# Patient Record
Sex: Male | Born: 1965 | Race: White | Hispanic: No | Marital: Married | Smoking: Never smoker
Health system: Southern US, Community
[De-identification: ages and names within clinical notes are randomized; demographics above are authoritative.]

## PROBLEM LIST (undated history)

## (undated) DIAGNOSIS — N200 Calculus of kidney: Secondary | ICD-10-CM

## (undated) DIAGNOSIS — J302 Other seasonal allergic rhinitis: Secondary | ICD-10-CM

## (undated) DIAGNOSIS — E7800 Pure hypercholesterolemia, unspecified: Secondary | ICD-10-CM

## (undated) DIAGNOSIS — J45909 Unspecified asthma, uncomplicated: Secondary | ICD-10-CM

## (undated) DIAGNOSIS — S5290XA Unspecified fracture of unspecified forearm, initial encounter for closed fracture: Secondary | ICD-10-CM

## (undated) DIAGNOSIS — R002 Palpitations: Secondary | ICD-10-CM

## (undated) DIAGNOSIS — E785 Hyperlipidemia, unspecified: Secondary | ICD-10-CM

## (undated) DIAGNOSIS — S42309A Unspecified fracture of shaft of humerus, unspecified arm, initial encounter for closed fracture: Secondary | ICD-10-CM

## (undated) HISTORY — DX: Unspecified fracture of unspecified forearm, initial encounter for closed fracture: S52.90XA

## (undated) HISTORY — DX: Unspecified asthma, uncomplicated: J45.909

## (undated) HISTORY — DX: Other seasonal allergic rhinitis: J30.2

## (undated) HISTORY — DX: Pure hypercholesterolemia, unspecified: E78.00

## (undated) HISTORY — DX: Palpitations: R00.2

## (undated) HISTORY — DX: Calculus of kidney: N20.0

## (undated) HISTORY — DX: Unspecified fracture of shaft of humerus, unspecified arm, initial encounter for closed fracture: S42.309A

---

## 2000-08-05 DIAGNOSIS — N200 Calculus of kidney: Secondary | ICD-10-CM

## 2000-08-05 HISTORY — DX: Calculus of kidney: N20.0

## 2000-09-13 ENCOUNTER — Encounter: Payer: Self-pay | Admitting: Emergency Medicine

## 2000-09-13 ENCOUNTER — Emergency Department (HOSPITAL_COMMUNITY): Admission: EM | Admit: 2000-09-13 | Discharge: 2000-09-13 | Payer: Self-pay | Admitting: Emergency Medicine

## 2012-08-07 ENCOUNTER — Emergency Department (HOSPITAL_BASED_OUTPATIENT_CLINIC_OR_DEPARTMENT_OTHER)
Admission: EM | Admit: 2012-08-07 | Discharge: 2012-08-07 | Disposition: A | Discharge status: Home / Self Care | Payer: BC Managed Care – PPO | Attending: Emergency Medicine | Admitting: Emergency Medicine

## 2012-08-07 ENCOUNTER — Encounter (HOSPITAL_BASED_OUTPATIENT_CLINIC_OR_DEPARTMENT_OTHER): Payer: Self-pay | Admitting: *Deleted

## 2012-08-07 ENCOUNTER — Emergency Department (HOSPITAL_BASED_OUTPATIENT_CLINIC_OR_DEPARTMENT_OTHER): Payer: BC Managed Care – PPO

## 2012-08-07 DIAGNOSIS — R110 Nausea: Secondary | ICD-10-CM | POA: Insufficient documentation

## 2012-08-07 DIAGNOSIS — Z79899 Other long term (current) drug therapy: Secondary | ICD-10-CM | POA: Insufficient documentation

## 2012-08-07 DIAGNOSIS — R002 Palpitations: Secondary | ICD-10-CM | POA: Insufficient documentation

## 2012-08-07 DIAGNOSIS — E785 Hyperlipidemia, unspecified: Secondary | ICD-10-CM | POA: Insufficient documentation

## 2012-08-07 HISTORY — DX: Hyperlipidemia, unspecified: E78.5

## 2012-08-07 LAB — COMPREHENSIVE METABOLIC PANEL
ALT: 51 U/L (ref 0–53)
AST: 30 U/L (ref 0–37)
Albumin: 4.3 g/dL (ref 3.5–5.2)
CO2: 25 mEq/L (ref 19–32)
Chloride: 102 mEq/L (ref 96–112)
Creatinine, Ser: 0.8 mg/dL (ref 0.50–1.35)
Sodium: 138 mEq/L (ref 135–145)
Total Bilirubin: 0.7 mg/dL (ref 0.3–1.2)

## 2012-08-07 LAB — CBC WITH DIFFERENTIAL/PLATELET
Basophils Absolute: 0 10*3/uL (ref 0.0–0.1)
Basophils Relative: 0 % (ref 0–1)
HCT: 45.8 % (ref 39.0–52.0)
Lymphocytes Relative: 26 % (ref 12–46)
MCHC: 35.8 g/dL (ref 30.0–36.0)
Monocytes Absolute: 0.5 10*3/uL (ref 0.1–1.0)
Neutro Abs: 3.2 10*3/uL (ref 1.7–7.7)
Neutrophils Relative %: 64 % (ref 43–77)
Platelets: 207 10*3/uL (ref 150–400)
RDW: 12.6 % (ref 11.5–15.5)
WBC: 5.1 10*3/uL (ref 4.0–10.5)

## 2012-08-07 LAB — D-DIMER, QUANTITATIVE: D-Dimer, Quant: <0.27 ug/mL-FEU (ref 0.00–0.48)

## 2012-08-07 LAB — TROPONIN I: Troponin I: <0.3 ng/mL (ref <0.30)

## 2012-08-07 MED ORDER — ASPIRIN 81 MG PO CHEW
324.0000 mg | CHEWABLE_TABLET | Freq: Once | ORAL | Status: AC
  Administered 2012-08-07: 324 mg via ORAL

## 2012-08-07 MED ORDER — ASPIRIN 81 MG PO CHEW
CHEWABLE_TABLET | ORAL | Status: AC
  Administered 2012-08-07: 324 mg via ORAL
  Filled 2012-08-07: qty 4

## 2012-08-07 MED ORDER — GI COCKTAIL ~~LOC~~
30.0000 mL | Freq: Once | ORAL | Status: AC
  Administered 2012-08-07: 30 mL via ORAL

## 2012-08-07 MED ORDER — GI COCKTAIL ~~LOC~~
ORAL | Status: AC
  Administered 2012-08-07: 30 mL via ORAL
  Filled 2012-08-07: qty 30

## 2012-08-07 NOTE — ED Provider Notes (Signed)
History     CSN: [id number]  Arrival date & time 08/07/12  4098   First MD Initiated Contact with Patient 08/07/12 [phone]      Chief Complaint  Patient presents with  . Palpitations    (Consider location/radiation/quality/duration/timing/severity/associated sxs/prior treatment) Patient is a 47 y.o. male presenting with palpitations. The history is provided by the patient.  Palpitations  This is a recurrent problem. The current episode started 3 to 5 hours ago. The problem occurs constantly. The problem has not changed since onset.Associated with: none. Associated symptoms include nausea. Pertinent negatives include no diaphoresis, no fever, no chest pain, no chest pressure, no exertional chest pressure, no irregular heartbeat, no orthopnea, no PND, no syncope, no lower extremity edema and no cough. Associated symptoms comments: Denies shortness of breath to EDP. He has tried nothing for the symptoms. The treatment provided no relief. Risk factors include being male. His past medical history does not include hyperthyroidism.    Past Medical History  Diagnosis Date  . Hyperlipidemia     History reviewed. No pertinent past surgical history.  No family history on file.  History  Substance Use Topics  . Smoking status: Never Smoker   . Smokeless tobacco: Not on file  . Alcohol Use: Yes      Review of Systems  Constitutional: Negative for fever and diaphoresis.  Respiratory: Negative for cough, chest tightness and wheezing.   Cardiovascular: Positive for palpitations. Negative for chest pain, orthopnea, leg swelling, syncope and PND.  Gastrointestinal: Positive for nausea.  All other systems reviewed and are negative.    Allergies  Review of patient's allergies indicates no known allergies.  Home Medications   Current Outpatient Rx  Name  Route  Sig  Dispense  Refill  . LORATADINE 10 MG PO TABS   Oral   Take 10 mg by mouth daily.           BP 139/82  Pulse 80   Temp 97.8 F (36.6 C) (Oral)  Resp 16  SpO2 100%  Physical Exam  Constitutional: He is oriented to person, place, and time. He appears well-developed and well-nourished. No distress.  HENT:  Head: Normocephalic and atraumatic.  Mouth/Throat: No oropharyngeal exudate.  Eyes: Conjunctivae normal are normal. Pupils are equal, round, and reactive to light.  Neck: Normal range of motion.  Cardiovascular: Normal rate, regular rhythm, normal heart sounds and intact distal pulses.  Exam reveals no gallop and no friction rub.   No murmur heard. Pulmonary/Chest: Effort normal and breath sounds normal. He has no wheezes. He has no rales.  Abdominal: Soft. Bowel sounds are increased. There is no tenderness. There is no rebound and no guarding.  Musculoskeletal: Normal range of motion. He exhibits no edema.  Neurological: He is alert and oriented to person, place, and time.  Skin: Skin is warm and dry. He is not diaphoretic.  Psychiatric: He has a normal mood and affect.    ED Course  Procedures (including critical care time)  Labs Reviewed  COMPREHENSIVE METABOLIC PANEL - Abnormal; Notable for the following:    Potassium 3.4 (*)     Glucose, Bld 123 (*)     All other components within normal limits  CBC WITH DIFFERENTIAL  TROPONIN I  D-DIMER, QUANTITATIVE   Dg Chest Portable 1 View  08/07/2012  *RADIOLOGY REPORT*  Clinical Data: Shortness of breath and nausea.  PORTABLE CHEST - 1 VIEW  Comparison: None.  Findings: The lungs are well-aerated and clear.  There is  no evidence of focal opacification, pleural effusion or pneumothorax.  The cardiomediastinal silhouette is within normal limits.  No acute osseous abnormalities are seen.  IMPRESSION: No acute cardiopulmonary process seen.   Original Report Authenticated By: Tonia Ghent, M.D.      No diagnosis found.    MDM   Date: 08/07/2012  Rate: 82  Rhythm: normal sinus rhythm  QRS Axis: normal  Intervals: normal  ST/T Wave  abnormalities: normal  Conduction Disutrbances:none  Narrative Interpretation: 2 pvcs  Old EKG Reviewed: none available  With second set of enzymes   Date: 08/07/2012  Rate: 66  Rhythm: normal sinus rhythm  QRS Axis: normal  Intervals: normal  ST/T Wave abnormalities: normal  Conduction Disutrbances: none  Narrative Interpretation: unremarkable   Feels palpitations on exam and feels like heart is fast and irregular, on exam and monitor HR is RRR and in the 70s.  Suspect symptoms related to GERD as patient has burping and hyperactive bowel sounds in the setting of ongoing symptoms 2 negative ekgs and enzymes is sufficient to exclude ACS.  Follow up with your doctor and cardiology for stress test. Return for Chest pain shortness of breath or any concerns.  Patient verbalizes understanding and agrees to follow up        Adriane Guglielmo Smitty Cords, MD 08/07/12 [phone]

## 2012-08-07 NOTE — ED Notes (Signed)
MD at bedside. 

## 2012-08-07 NOTE — ED Notes (Signed)
Pt sts he awoke at apprx. 12:30am tonight feeling as if his heart was going to beat out of his chest. Pt sts some SOB and nausea.

## 2017-01-31 ENCOUNTER — Telehealth: Payer: Self-pay | Admitting: *Deleted

## 2017-01-31 NOTE — Telephone Encounter (Signed)
Referal notes from Tally Joeavid Swayne , [facility] sent to scheduling

## 2017-02-21 ENCOUNTER — Encounter: Payer: Self-pay | Admitting: *Deleted

## 2017-03-10 DIAGNOSIS — E785 Hyperlipidemia, unspecified: Secondary | ICD-10-CM | POA: Insufficient documentation

## 2017-03-10 DIAGNOSIS — Z8249 Family history of ischemic heart disease and other diseases of the circulatory system: Secondary | ICD-10-CM | POA: Insufficient documentation

## 2017-03-11 ENCOUNTER — Encounter: Payer: Self-pay | Admitting: Interventional Cardiology

## 2017-03-11 ENCOUNTER — Encounter (INDEPENDENT_AMBULATORY_CARE_PROVIDER_SITE_OTHER): Payer: Self-pay

## 2017-03-11 ENCOUNTER — Ambulatory Visit (INDEPENDENT_AMBULATORY_CARE_PROVIDER_SITE_OTHER): Payer: BC Managed Care – PPO | Admitting: Interventional Cardiology

## 2017-03-11 VITALS — BP 116/84 | HR 79 | Ht 68.0 in | Wt 144.8 lb

## 2017-03-11 DIAGNOSIS — Z8249 Family history of ischemic heart disease and other diseases of the circulatory system: Secondary | ICD-10-CM

## 2017-03-11 DIAGNOSIS — E785 Hyperlipidemia, unspecified: Secondary | ICD-10-CM

## 2017-03-11 NOTE — Patient Instructions (Signed)
Medication Instructions:  None  Labwork: None  Testing/Procedures: None  Follow-Up: Your physician recommends that you schedule a follow-up appointment as needed with Dr. Smith.    Any Other Special Instructions Will Be Listed Below (If Applicable).     If you need a refill on your cardiac medications before your next appointment, please call your pharmacy.   

## 2017-03-11 NOTE — Progress Notes (Signed)
Cardiology Office Note    Date:  03/11/2017   ID:  Craig Vega, DOB Jan 21, 1966, MRN [id number]  PCP:  Craig Vega, David, MD  Cardiologist: Craig NoeHenry W Smith III, MD   Chief Complaint  Patient presents with  . Advice Only    Wonders if he should have calcium scoring    History of Present Illness:  Craig Vega is a 51 y.o. male referred by Dr. Tally Joeavid Vega for consultation concerning further cardiac testing and to discuss primary prevention.  The patient is asymptomatic. He is quite active, not overweight, and attentive to potential risk factors for vascular events. His father died suddenly at age 49 with no prior history of cardiac disease. His father had a history of elevated cholesterol and did not except the idea of primary prevention. Craig Vega does not smoke, he is not diabetic, has no history of hypertension, and has no physical limitations with reference to physical activity. He denies claudication.  Past Medical History:  Diagnosis Date  . Asthma   . Broken arm    left  . Broken radius    right radial head  . Hypercholesteremia   . Hyperlipidemia   . Nephrolithiasis 2002  . Palpitations   . Seasonal allergic rhinitis   . Seasonal allergies     No past surgical history on file.  Current Medications: Outpatient Medications Prior to Visit  Medication Sig Dispense Refill  . ibuprofen (ADVIL,MOTRIN) 200 MG tablet Take 200 mg by mouth every 6 (six) hours as needed.    . loratadine (CLARITIN) 10 MG tablet Take 10 mg by mouth daily.     No facility-administered medications prior to visit.      Allergies:   Patient has no known allergies.   Social History   Social History  . Marital status: Married    Spouse name: N/A  . Number of children: N/A  . Years of education: N/A   Social History Main Topics  . Smoking status: Never Smoker  . Smokeless tobacco: Never Used     Comment: smoked 5 cigarettes  . Alcohol use 1.5 oz/week    3 Standard drinks or  equivalent per week  . Drug use: Unknown  . Sexual activity: Not Asked   Other Topics Concern  . None   Social History Narrative  . None     Family History:  The patient's family history includes Diabetes in his paternal grandfather; Heart attack in his father; Hyperlipidemia in his brother, father, maternal grandmother, and mother; Hypertension in his brother, father, maternal grandmother, mother, and paternal grandfather; Lung cancer in his maternal grandfather; Migraines in his brother.   ROS:   Please see the history of present illness.    Has had blood in his stool that he feels is related to hemorrhoids. Occasional diarrhea and low back pain. Rare episodes of dizziness that he feels has started to beginning rosuvastatin. History of kidney stone All other systems reviewed and are negative.   PHYSICAL EXAM:   VS:  BP 116/84 (BP Location: Left Arm)   Pulse 79   Ht 5\' 8"  (1.727 m)   Wt 144 lb 12.8 oz (65.7 kg)   BMI 22.02 kg/m    GEN: Well nourished, well developed, in no acute distress  HEENT: normal  Neck: no JVD, carotid bruits, or masses Cardiac: RRR; no murmurs, rubs, or gallops,no edema  Respiratory:  clear to auscultation bilaterally, normal work of breathing GI: soft, nontender, nondistended, + BS MS: no deformity or  atrophy  Skin: warm and dry, no rash Neuro:  Alert and Oriented x 3, Strength and sensation are intact Psych: euthymic mood, full affect  Wt Readings from Last 3 Encounters:  03/11/17 144 lb 12.8 oz (65.7 kg)      Studies/Labs Reviewed:   EKG:  EKG  Incomplete right bundle, normal sinus rhythm, nonspecific ST abnormality. No prior tracings to compare. No change when compared to 2014.  Recent Labs: No results found for requested labs within last 8760 hours.   Lipid Panel No results found for: CHOL, TRIG, HDL, CHOLHDL, VLDL, LDLCALC, LDLDIRECT  Additional studies/ records that were reviewed today include:  Abdominal and pelvic CT performed in  2002 did not reveal evidence of arterial sclerosis/calcification    ASSESSMENT:    1. Hyperlipidemia with target LDL less than 100   2. Family history of early CAD      PLAN:  In order of problems listed above:  1. I recommend that the patient began enteric coated aspirin 81 mg per day. Continue low-dose statin therapy with LDL target of 70. I do not believe that a coronary calcium score will change anything that we are doing at this point. If a coronary calcium score is performed and the patient has a non-atherogenic advanced lipid profile (i.e. normal particle number and or large particles, we could potentially artery to discontinue statin therapy. After significant conversation with the patient, he would not be inclined to discontinue treatment therefore no further evaluation was recommended.  Overall the patient seems to be doing well. There is no evidence of atherosclerosis on exam are noted on prior imaging. I do not believe a coronary calcium score be helpful at this point. I would be  more aggressive with lipid lowering to LDL of 70 assuming no side effects to therapy. We discussed a low-fat plant-based diet. I encouraged increase physical activity. Return as needed for counseling.  Prolonged encounter with greater than 50% of the time spent in counseling in face-to-face manner with the patient concerning risk factor modification, imaging, and possibility of medication toxicity.  Medication Adjustments/Labs and Tests Ordered: Current medicines are reviewed at length with the patient today.  Concerns regarding medicines are outlined above.  Medication changes, Labs and Tests ordered today are listed in the Patient Instructions below. Patient Instructions  Medication Instructions:  None  Labwork: None  Testing/Procedures: None  Follow-Up: Your physician recommends that you schedule a follow-up appointment as needed with Dr. Katrinka Vega.    Any Other Special Instructions Will Be  Listed Below (If Applicable).     If you need a refill on your cardiac medications before your next appointment, please call your pharmacy.      Signed, Craig Noe, MD  03/11/2017 7:27 PM    [facility] [address] [place], [place], [place]  [address] Phone: [phone]; Fax: [phone]

## 2020-07-14 ENCOUNTER — Ambulatory Visit: Payer: BC Managed Care – PPO | Admitting: Internal Medicine

## 2020-07-14 ENCOUNTER — Other Ambulatory Visit: Payer: Self-pay

## 2020-07-14 VITALS — BP 126/86 | HR 78 | Ht 68.0 in | Wt 148.6 lb

## 2020-07-14 DIAGNOSIS — E785 Hyperlipidemia, unspecified: Secondary | ICD-10-CM

## 2020-07-14 DIAGNOSIS — Z7189 Other specified counseling: Secondary | ICD-10-CM

## 2020-07-14 NOTE — Progress Notes (Signed)
LIPID CLINIC CONSULT NOTE  Chief Complaint:  Manage dyslipidemia  Primary Care Physician: Tally Joe, MD  Primary Cardiologist:  No primary care provider on file.  HPI:  Craig Vega is a 54 y.o. male who is being seen today for the evaluation of dyslipidemia at the request of Tally Joe, MD.  This is a pleasant 54 year old male kindly referred for evaluation management of dyslipidemia.  He had a recent lipid profile in February 2021 showing a total cholesterol of 180, HDL 50, LDL 100 and triglycerides 628.  Subsequently his lipids have improved even further with total cholesterol 179, triglycerides 143, HDL 54 and LDL 97.  He is on 5 mg of rosuvastatin.  He had previously seen my partner Dr. Katrinka Blazing for risk assessment.  He was felt to be at low risk and no further imaging or particularly a calcium score was thought to be of benefit.  Mr. Tugwell does have a history of heart disease in his family including his father who had died of an acute MI in his 59s.  Other than this he does not have any early onset heart disease, diabetes, hypertension, obesity or other significant early heart risk factors.  He is here to determine if he needs additional therapy and to further characterize his cardiac risk.  PMHx:  Past Medical History:  Diagnosis Date  . Asthma   . Broken arm    left  . Broken radius    right radial head  . Hypercholesteremia   . Hyperlipidemia   . Nephrolithiasis 2002  . Palpitations   . Seasonal allergic rhinitis   . Seasonal allergies     No past surgical history on file.  FAMHx:  Family History  Problem Relation Age of Onset  . Hyperlipidemia Mother   . Hypertension Mother   . Hypertension Father   . Hyperlipidemia Father   . Heart attack Father   . Hypertension Brother   . Migraines Brother   . Hyperlipidemia Brother   . Hyperlipidemia Maternal Grandmother   . Hypertension Maternal Grandmother   . Lung cancer Maternal Grandfather   .  Hypertension Paternal Grandfather   . Diabetes Paternal Grandfather     SOCHx:   reports that he has never smoked. He has never used smokeless tobacco. He reports current alcohol use of about 3.0 standard drinks of alcohol per week. No history on file for drug use.  ALLERGIES:  No Known Allergies  ROS: Pertinent items noted in HPI and remainder of comprehensive ROS otherwise negative.  HOME MEDS: Current Outpatient Medications on File Prior to Visit  Medication Sig Dispense Refill  . ibuprofen (ADVIL,MOTRIN) 200 MG tablet Take 200 mg by mouth every 6 (six) hours as needed.    . loratadine (CLARITIN) 10 MG tablet Take 10 mg by mouth daily.    . Multiple Vitamin (MULTI-VITAMIN) tablet Take 1 tablet by mouth daily.    . rosuvastatin (CRESTOR) 5 MG tablet Take 5 mg by mouth daily.     No current facility-administered medications on file prior to visit.    LABS/IMAGING: No results found for this or any previous visit (from the past 48 hour(s)). No results found.  LIPID PANEL: No results found for: CHOL, TRIG, HDL, CHOLHDL, VLDL, LDLCALC, LDLDIRECT  WEIGHTS: Wt Readings from Last 3 Encounters:  07/14/20 148 lb 9.6 oz (67.4 kg)  03/11/17 144 lb 12.8 oz (65.7 kg)    VITALS: BP 126/86   Pulse 78   Ht 5\' 8"  (1.727  m)   Wt 148 lb 9.6 oz (67.4 kg)   BMI 22.59 kg/m   EXAM: General appearance: alert and no distress Neck: no carotid bruit, no JVD and thyroid not enlarged, symmetric, no tenderness/mass/nodules Lungs: clear to auscultation bilaterally Heart: regular rate and rhythm, S1, S2 normal, no murmur, click, rub or gallop Abdomen: soft, non-tender; bowel sounds normal; no masses,  no organomegaly Extremities: extremities normal, atraumatic, no cyanosis or edema Pulses: 2+ and symmetric Skin: Skin color, texture, turgor normal. No rashes or lesions Neurologic: Grossly normal Psych: Pleasant  EKG: Deferred  ASSESSMENT: 1. Mixed dyslipidemia, goal LDL less than  100 2. Family history of coronary disease, not premature onset in his father 3. No cardiac risk based on pooled cohort-0.9% 10-year risk  PLAN: 1.   Mr. Maiorino has a mixed dyslipidemia and is currently at target LDL less than 100 on low-dose rosuvastatin.  There is a family history of heart disease in his father.  I would categorize him as low risk based on the pooled cohort equation calculation.  That being said, he is concerned about the possibility of premature onset coronary disease.  I would therefore recommend a coronary calcium score.  If this is 0 or low risk then I would continue his current treatments.  If his calcium score was significantly elevated then we will escalate his therapy to target lower LDL.  I have encouraged continued healthy diet-currently a pescatarian, physical activity and healthy lifestyle choices.  Follow-up with me after. Thanks for the kind referral.  Chrystie Nose, MD, [facility]  [place]  [facility]  Medical Director of the Advanced Lipid Disorders &  Cardiovascular Risk Reduction Clinic Diplomate of the American Board of Clinical Lipidology Attending Cardiologist  Direct Dial: [phone]  Fax: [phone]  Website:  [email or web address] 07/14/2020, 3:56 PM

## 2020-07-14 NOTE — Patient Instructions (Signed)
Medication Instructions:  Your physician recommends that you continue on your current medications as directed. Please refer to the Current Medication list given to you today.  *If you need a refill on your cardiac medications before your next appointment, please call your pharmacy*  Testing/Procedures: Dr. Debara Pickett has ordered a CT coronary calcium score. This test is done at [address]. This is $150 out of pocket.   Coronary CalciumScan A coronary calcium scan is an imaging test used to look for deposits of calcium and other fatty materials (plaques) in the inner lining of the blood vessels of the heart (coronary arteries). These deposits of calcium and plaques can partly clog and narrow the coronary arteries without producing any symptoms or warning signs. This puts a person at risk for a heart attack. This test can detect these deposits before symptoms develop. Tell a health care provider about:  Any allergies you have.  All medicines you are taking, including vitamins, herbs, eye drops, creams, and over-the-counter medicines.  Any problems you or family members have had with anesthetic medicines.  Any blood disorders you have.  Any surgeries you have had.  Any medical conditions you have.  Whether you are pregnant or may be pregnant. What are the risks? Generally, this is a safe procedure. However, problems may occur, including:  Harm to a pregnant woman and her unborn baby. This test involves the use of radiation. Radiation exposure can be dangerous to a pregnant woman and her unborn baby. If you are pregnant, you generally should not have this procedure done.  Slight increase in the risk of cancer. This is because of the radiation involved in the test. What happens before the procedure? No preparation is needed for this procedure. What happens during the procedure?  You will undress and remove any jewelry around your neck or chest.  You will put on a  hospital gown.  Sticky electrodes will be placed on your chest. The electrodes will be connected to an electrocardiogram (ECG) machine to record a tracing of the electrical activity of your heart.  A CT scanner will take pictures of your heart. During this time, you will be asked to lie still and hold your breath for 2-3 seconds while a picture of your heart is being taken. The procedure may vary among health care providers and hospitals. What happens after the procedure?  You can get dressed.  You can return to your normal activities.  It is up to you to get the results of your test. Ask your health care provider, or the department that is doing the test, when your results will be ready. Summary  A coronary calcium scan is an imaging test used to look for deposits of calcium and other fatty materials (plaques) in the inner lining of the blood vessels of the heart (coronary arteries).  Generally, this is a safe procedure. Tell your health care provider if you are pregnant or may be pregnant.  No preparation is needed for this procedure.  A CT scanner will take pictures of your heart.  You can return to your normal activities after the scan is done. This information is not intended to replace advice given to you by your health care provider. Make sure you discuss any questions you have with your health care provider. Document Released: 01/18/2008 Document Revised: 06/10/2016 Document Reviewed: 06/10/2016 Elsevier Interactive Patient Education  2017 [employer].     Follow-Up: At [facility], you and your health needs are  our priority.  As part of our continuing mission to provide you with exceptional heart care, we have created designated Provider Care Teams.  These Care Teams include your primary Cardiologist (physician) and Advanced Practice Providers (APPs -  Physician Assistants and Nurse Practitioners) who all work together to provide you with the care you need, when you need  it.  We recommend signing up for the patient portal called "MyChart".  Sign up information is provided on this After Visit Summary.  MyChart is used to connect with patients for Virtual Visits (Telemedicine).  Patients are able to view lab/test results, encounter notes, upcoming appointments, etc.  Non-urgent messages can be sent to your provider as well.   To learn more about what you can do with MyChart, go to [email or web address].    Your next appointment:   1 month(s)  The format for your next appointment:   In Person  Provider:   K. Italy Hilty, MD   Other Instructions Follow up to made in the lipid clinic shortly after your Calcium score is completed.

## 2020-07-25 ENCOUNTER — Ambulatory Visit (INDEPENDENT_AMBULATORY_CARE_PROVIDER_SITE_OTHER)
Admission: RE | Admit: 2020-07-25 | Discharge: 2020-07-25 | Disposition: A | Discharge status: Home / Self Care | Payer: Self-pay | Source: Ambulatory Visit | Attending: Internal Medicine | Admitting: Internal Medicine

## 2020-07-25 ENCOUNTER — Other Ambulatory Visit: Payer: Self-pay

## 2020-07-25 DIAGNOSIS — E785 Hyperlipidemia, unspecified: Secondary | ICD-10-CM

## 2020-09-01 ENCOUNTER — Encounter: Payer: Self-pay | Admitting: Internal Medicine

## 2020-09-01 ENCOUNTER — Telehealth (INDEPENDENT_AMBULATORY_CARE_PROVIDER_SITE_OTHER): Payer: BC Managed Care – PPO | Admitting: Internal Medicine

## 2020-09-01 VITALS — Ht 68.0 in | Wt 142.0 lb

## 2020-09-01 DIAGNOSIS — Z7189 Other specified counseling: Secondary | ICD-10-CM

## 2020-09-01 DIAGNOSIS — E785 Hyperlipidemia, unspecified: Secondary | ICD-10-CM | POA: Diagnosis not present

## 2020-09-01 NOTE — Patient Instructions (Signed)
Medication Instructions:  Your physician recommends that you continue on your current medications as directed. Please refer to the Current Medication list given to you today.  Follow-Up: At [facility], you and your health needs are our priority.  As part of our continuing mission to provide you with exceptional heart care, we have created designated Provider Care Teams.  These Care Teams include your primary Cardiologist (physician) and Advanced Practice Providers (APPs -  Physician Assistants and Nurse Practitioners) who all work together to provide you with the care you need, when you need it.  We recommend signing up for the patient portal called "MyChart".  Sign up information is provided on this After Visit Summary.  MyChart is used to connect with patients for Virtual Visits (Telemedicine).  Patients are able to view lab/test results, encounter notes, upcoming appointments, etc.  Non-urgent messages can be sent to your provider as well.   To learn more about what you can do with MyChart, go to [email or web address].    Your next appointment:   AS NEEDED with Dr. [facility]

## 2020-09-01 NOTE — Progress Notes (Signed)
Virtual Visit via Telephone Note   This visit type was conducted due to national recommendations for restrictions regarding the COVID-19 Pandemic (e.g. social distancing) in an effort to limit this patient's exposure and mitigate transmission in our community.  Due to his co-morbid illnesses, this patient is at least at moderate risk for complications without adequate follow up.  This format is felt to be most appropriate for this patient at this time.  The patient did not have access to video technology/had technical difficulties with video requiring transitioning to audio format only (telephone).  All issues noted in this document were discussed and addressed.  No physical exam could be performed with this format.  Please refer to the patient's chart for his  consent to telehealth for [facility].   Date:  09/01/2020   ID:  Craig Vega, DOB 02/08/1966, MRN [id number] The patient was identified using 2 identifiers.  Evaluation Performed:  Follow-Up Visit  Patient Location:  [address] [place] [address]  Provider location:   [address], [address] [place], [place] [address]  PCP:  Tally Joe, MD  Cardiologist:  No primary care provider on file. Electrophysiologist:  None   Chief Complaint:  Follow-up dyslipidemia  History of Present Illness:    Craig Vega is a 55 y.o. male who presents via audio/video conferencing for a telehealth visit today.   This is a pleasant 55 year old male kindly referred for evaluation management of dyslipidemia.  He had a recent lipid profile in February 2021 showing a total cholesterol of 180, HDL 50, LDL 100 and triglycerides 440.  Subsequently his lipids have improved even further with total cholesterol 179, triglycerides 143, HDL 54 and LDL 97.  He is on 5 mg of rosuvastatin.  He had previously seen my partner Dr. Katrinka Blazing for risk assessment.  He was felt to be at low risk and no further imaging or particularly a calcium score  was thought to be of benefit.  Craig Vega does have a history of heart disease in his family including his father who had died of an acute MI in his 23s.  Other than this he does not have any early onset heart disease, diabetes, hypertension, obesity or other significant early heart risk factors.  He is here to determine if he needs additional therapy and to further characterize his cardiac risk.  09/01/2020  Craig Vega returns today for follow-up.  He underwent coronary calcium scoring on July 25, 2020.  This  demonstrated 0 coronary calcium.  I reviewed those results with him today and the fact that this is associated with a very good prognosis going forward.  His most recent lipid profile did show that his LDL was less than 100 which I think he is appropriately aggressive.  Many providers would not even recommend lipid-lowering therapy with no coronary calcium given the favorable prognosis however he does have family history of heart disease including his father who died of an MI in his 10s.  I think is very reasonable however to stay on low-dose rosuvastatin based on where his numbers are at this point.  There was some evidence of mild hepatic steatosis as well and the statin might be beneficial for that.  I also recommended exercise particularly HIIT training which has been studied the most and demonstrated to improve hepatic steatosis.  The patient does not have symptoms concerning for COVID-19 infection (fever, chills, cough, or new SHORTNESS OF BREATH).    Prior CV studies:  The following studies were reviewed today:  Chart reviewed, labwork  PMHx:  Past Medical History:  Diagnosis Date  . Asthma   . Broken arm    left  . Broken radius    right radial head  . Hypercholesteremia   . Hyperlipidemia   . Nephrolithiasis 2002  . Palpitations   . Seasonal allergic rhinitis   . Seasonal allergies     History reviewed. No pertinent surgical history.  FAMHx:  Family History   Problem Relation Age of Onset  . Hyperlipidemia Mother   . Hypertension Mother   . Hypertension Father   . Hyperlipidemia Father   . Heart attack Father   . Hypertension Brother   . Migraines Brother   . Hyperlipidemia Brother   . Hyperlipidemia Maternal Grandmother   . Hypertension Maternal Grandmother   . Lung cancer Maternal Grandfather   . Hypertension Paternal Grandfather   . Diabetes Paternal Grandfather     SOCHx:   reports that he has never smoked. He has never used smokeless tobacco. He reports current alcohol use of about 3.0 standard drinks of alcohol per week. No history on file for drug use.  ALLERGIES:  No Known Allergies  MEDS:  Current Meds  Medication Sig  . ibuprofen (ADVIL,MOTRIN) 200 MG tablet Take 200 mg by mouth every 6 (six) hours as needed.  . loratadine (CLARITIN) 10 MG tablet Take 10 mg by mouth daily.  . Multiple Vitamin (MULTI-VITAMIN) tablet Take 1 tablet by mouth daily.  . rosuvastatin (CRESTOR) 5 MG tablet Take 5 mg by mouth daily.     ROS: Pertinent items noted in HPI and remainder of comprehensive ROS otherwise negative.  Labs/Other Tests and Data Reviewed:    Recent Labs: No results found for requested labs within last 8760 hours.   Recent Lipid Panel No results found for: CHOL, TRIG, HDL, CHOLHDL, LDLCALC, LDLDIRECT  Wt Readings from Last 3 Encounters:  09/01/20 142 lb (64.4 kg)  07/14/20 148 lb 9.6 oz (67.4 kg)  03/11/17 144 lb 12.8 oz (65.7 kg)     Exam:    Vital Signs:  Ht 5\' 8"  (1.727 m)   Wt 142 lb (64.4 kg)   BMI 21.59 kg/m    Exam deferred due to telephone visit  ASSESSMENT & PLAN:    1. Mixed dyslipidemia, goal LDL less than 100 2. Family history of coronary disease, not premature onset in his father 3. No cardiac risk based on pooled cohort-0.9% 10-year risk 4. CAC score 0, mild hepatic steatosis (07/2020)  Craig Vega had 0 coronary calcium and very mild hepatic steatosis on his CT.  Overall this predicts  a very low risk.  His pooled cohort risk is less than 1%.  His father did die of an MI however it was not considered premature onset coronary disease.  Currently his LDL cholesterol is less than 100 on rosuvastatin which I think is appropriate, especially given the fact that there is 0 coronary calcium.  The statin may also be helpful with his hepatic steatosis.  Would continue his current regimen.  Follow-up with me can be as needed.  COVID-19 Education: The signs and symptoms of COVID-19 were discussed with the patient and how to seek care for testing (follow up with PCP or arrange E-visit).  The importance of social distancing was discussed today.  Patient Risk:   After full review of this patients clinical status, I feel that they are at least moderate risk at this time.  Time:  Today, I have spent 15 minutes with the patient with telehealth technology discussing dyslipidemia.     Medication Adjustments/Labs and Tests Ordered: Current medicines are reviewed at length with the patient today.  Concerns regarding medicines are outlined above.   Tests Ordered: No orders of the defined types were placed in this encounter.   Medication Changes: No orders of the defined types were placed in this encounter.   Disposition:  prn  Chrystie Nose, MD, Milagros Loll  [place]  [facility]  Medical Director of the [facility] Diplomate of the American Board of Clinical Lipidology Attending Cardiologist  Direct Dial: [phone]  Fax: [phone]  Website:  [email or web address]  Chrystie Nose, MD  09/01/2020 8:24 AM

## 2022-04-11 IMAGING — CT CT CARDIAC CORONARY ARTERY CALCIUM SCORE
3 series · 14 of 20 positions shown, 15 images · non-contrast
Comparison: 08/07/2012 chest radiograph
COMPARISON: 08/07/2012 chest radiograph

Addendum:
EXAM:
OVER-READ INTERPRETATION  CT CHEST

The following report is an over-read performed by radiologist Dr.
over-read does not include interpretation of cardiac or coronary
anatomy or pathology. The calcium score interpretation by the
cardiologist is attached.
CLINICAL DATA: Risk stratification
Coronary Calcium Score
TECHNIQUE: The patient was scanned on a Siemens Force scanner. Axial
non-contrast 3 mm slices were carried out through the heart. The
data set was analyzed on a dedicated work station and scored using
the Agatson method.

[Series 2: casc 3.0 bv41 2 bestdiast 72 % · axial · 0.40mm/px · z∈[-331,-268]mm · 4 of 37 slices shown, 5 images]
[im 8/37  vessel]
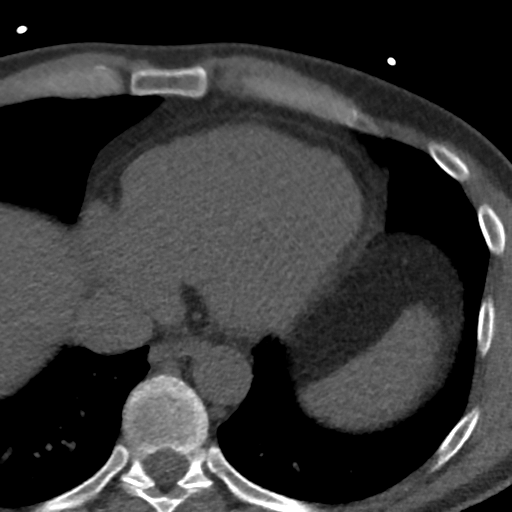
[im 8/37  lung]
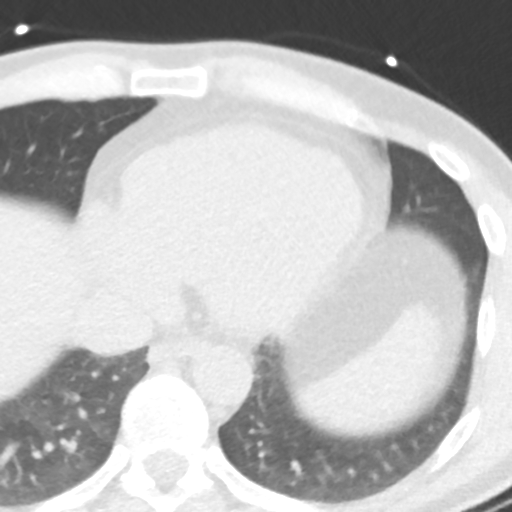
[im 15/37  vessel]
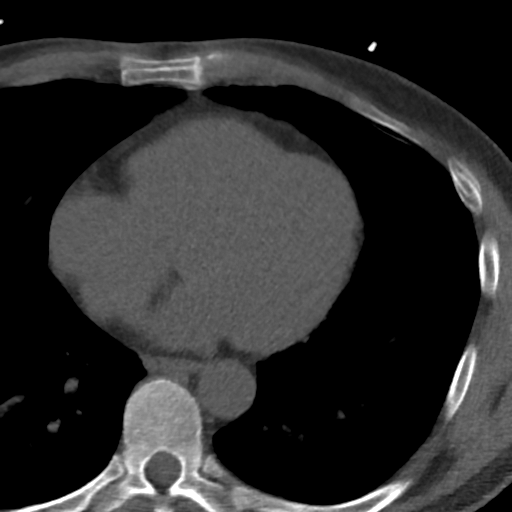
[im 22/37  vessel]
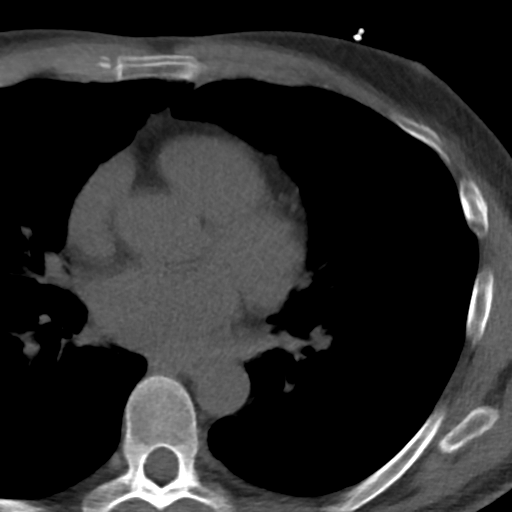
[im 29/37  vessel]
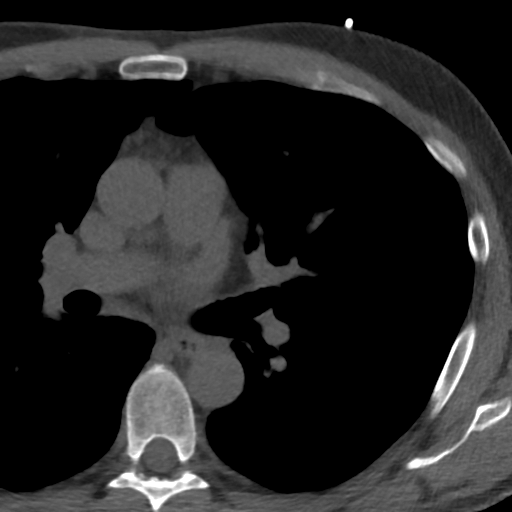

[Series 3: lung 71 % · axial · 0.67mm/px · z∈[-334,-262]mm · 5 of 37 slices shown]
[im 7/37  lung]
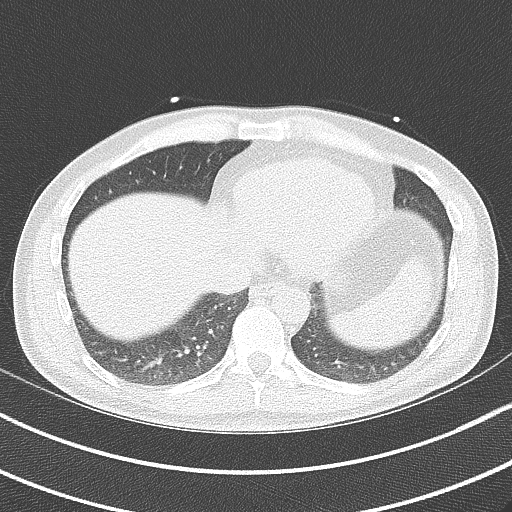
[im 13/37  lung]
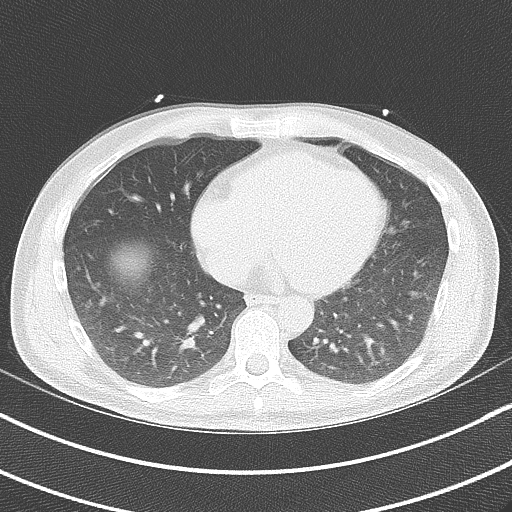
[im 19/37  lung]
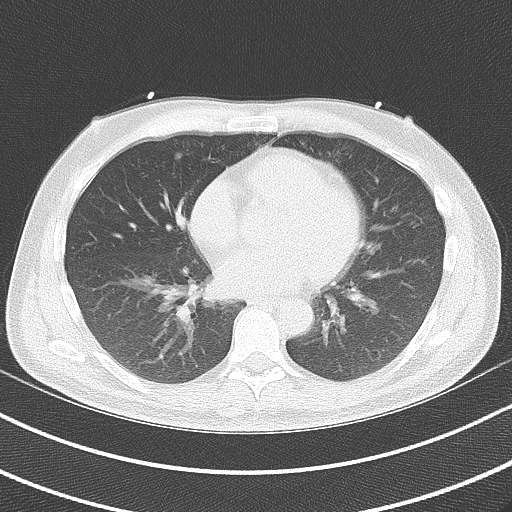
[im 25/37  lung]
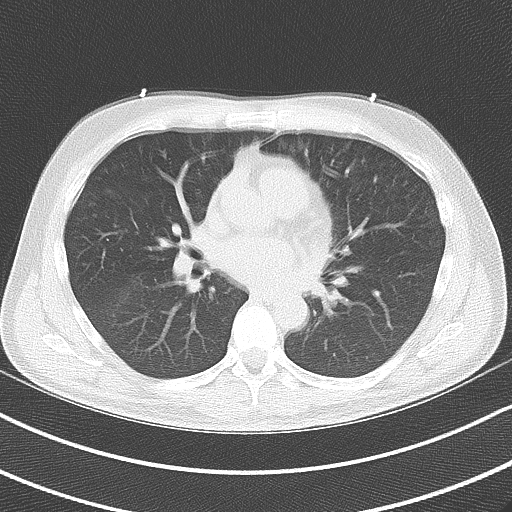
[im 31/37  lung]
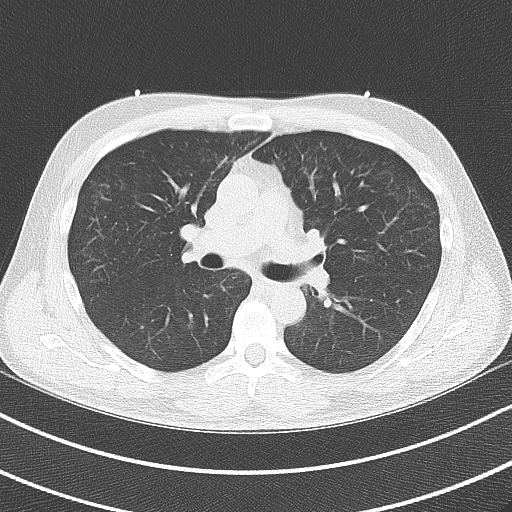

[Series 4: lung st 71 % · axial · 0.67mm/px · z∈[-334,-262]mm · 5 of 37 slices shown]
[im 7/37  lung]
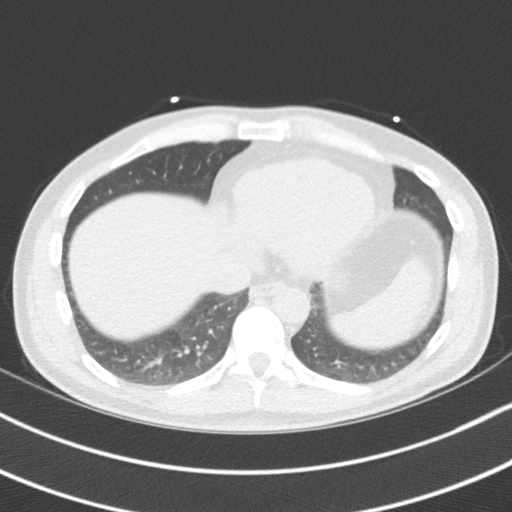
[im 13/37  lung]
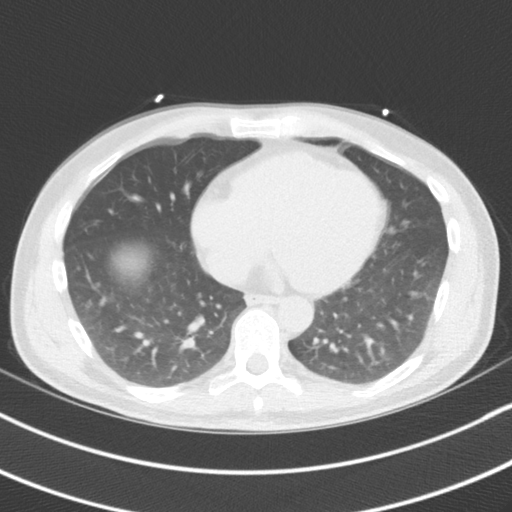
[im 19/37  lung]
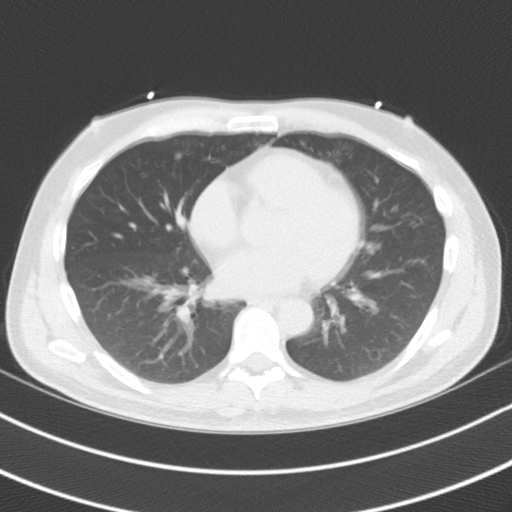
[im 25/37  lung]
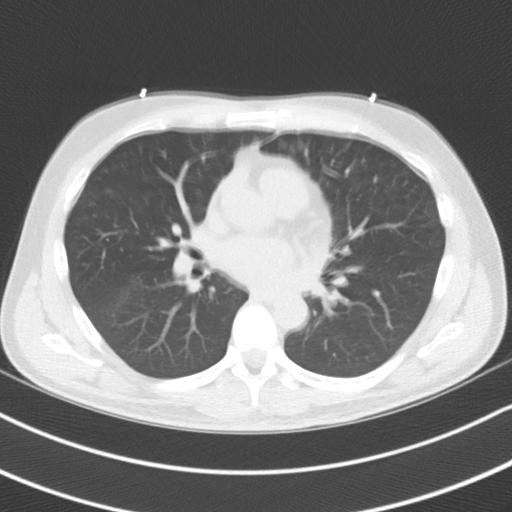
[im 31/37  lung]
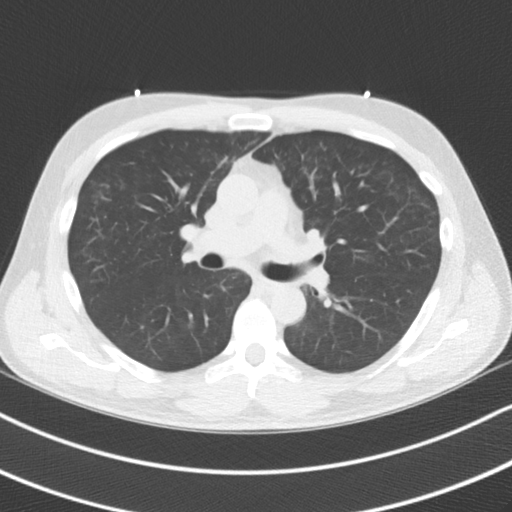

[14 of 20 positions shown; findings below may reference images not displayed]

FINDINGS: Vascular: Normal aortic caliber.

Mediastinum/Nodes: No imaged thoracic adenopathy.

Lungs/Pleura: No pleural fluid. Motion degradation in the mid and
lower chest.

Multifocal peribronchovascular interstitial thickening is mild.
Example in the left upper lobe on [DATE] and in the right upper lobe
on [DATE].

Upper Abdomen: Mild hepatic steatosis. Normal imaged portions of the
spleen.

Musculoskeletal: No acute osseous abnormality.
IMPRESSION: 1.  No acute findings in the imaged extracardiac chest.
2. Motion degradation in the mid and lower chest.
3. Nonspecific upper lobe predominant mild peribronchovascular
interstitial thickening. In this nonsmoker, likely the sequelae of
prior infection/inflammation.
4. Mild hepatic steatosis.
FINDINGS: Non-cardiac: See separate report from [REDACTED].

Ascending Aorta: Normal

Pericardium: Normal

Coronary arteries: Normal origin.
IMPRESSION: Coronary calcium score of 0. This was 0 percentile for age and sex
matched control.

Aleksandr Ospina,DO

*** End of Addendum ***
EXAM:
OVER-READ INTERPRETATION  CT CHEST

The following report is an over-read performed by radiologist Dr.
over-read does not include interpretation of cardiac or coronary
anatomy or pathology. The calcium score interpretation by the
cardiologist is attached.
FINDINGS: Vascular: Normal aortic caliber.

Mediastinum/Nodes: No imaged thoracic adenopathy.

Lungs/Pleura: No pleural fluid. Motion degradation in the mid and
lower chest.

Multifocal peribronchovascular interstitial thickening is mild.
Example in the left upper lobe on [DATE] and in the right upper lobe
on [DATE].

Upper Abdomen: Mild hepatic steatosis. Normal imaged portions of the
spleen.

Musculoskeletal: No acute osseous abnormality.
IMPRESSION: 1.  No acute findings in the imaged extracardiac chest.
2. Motion degradation in the mid and lower chest.
3. Nonspecific upper lobe predominant mild peribronchovascular
interstitial thickening. In this nonsmoker, likely the sequelae of
prior infection/inflammation.
4. Mild hepatic steatosis.
